# Patient Record
Sex: Female | Born: 2000
Health system: Southern US, Community
[De-identification: ages and names within clinical notes are randomized; demographics above are authoritative.]

---
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
## 2021-01-19 ENCOUNTER — Other Ambulatory Visit (HOSPITAL_COMMUNITY): Payer: Self-pay

## 2021-01-19 ENCOUNTER — Other Ambulatory Visit: Payer: Self-pay

## 2021-01-19 ENCOUNTER — Ambulatory Visit (HOSPITAL_COMMUNITY)
Admission: EM | Admit: 2021-01-19 | Discharge: 2021-01-19 | Disposition: A | Discharge status: Home / Self Care | Payer: 59 | Attending: Internal Medicine | Admitting: Internal Medicine

## 2021-01-19 DIAGNOSIS — Z20822 Contact with and (suspected) exposure to covid-19: Secondary | ICD-10-CM | POA: Diagnosis not present

## 2021-01-19 LAB — SARS CORONAVIRUS 2 (TAT 6-24 HRS): SARS Coronavirus 2: POSITIVE — AB

## 2021-01-19 MED FILL — Levonorgestrel & Ethinyl Estradiol Tab 0.1 MG-20 MCG: ORAL | 28 days supply | Qty: 28 | Fill #0 | Status: CN

## 2021-01-19 NOTE — ED Triage Notes (Signed)
Pt in requesting covid test due to exposure  Denies any sxs

## 2021-01-23 ENCOUNTER — Other Ambulatory Visit (HOSPITAL_COMMUNITY): Payer: Self-pay

## 2021-01-23 MED FILL — Levonorgestrel & Ethinyl Estradiol Tab 0.1 MG-20 MCG: ORAL | 84 days supply | Qty: 84 | Fill #0 | Status: AC

## 2021-01-29 ENCOUNTER — Other Ambulatory Visit (HOSPITAL_COMMUNITY): Payer: Self-pay

## 2021-01-29 MED ORDER — CETIRIZINE HCL 10 MG PO TABS
ORAL_TABLET | ORAL | 5 refills | Status: DC
  Filled 2021-01-29: qty 30, 30d supply, fill #0

## 2021-03-02 ENCOUNTER — Other Ambulatory Visit (HOSPITAL_COMMUNITY): Payer: Self-pay

## 2021-03-02 MED ORDER — BLISOVI 24 FE 1-20 MG-MCG(24) PO TABS
1.0000 | ORAL_TABLET | Freq: Every day | ORAL | 10 refills | Status: DC
  Filled 2021-03-02: qty 28, 28d supply, fill #0
  Filled 2021-03-26: qty 28, 28d supply, fill #1
  Filled 2021-04-27: qty 28, 28d supply, fill #2
  Filled 2021-06-08: qty 28, 28d supply, fill #3
  Filled 2021-07-05: qty 28, 28d supply, fill #4
  Filled 2021-08-03: qty 28, 28d supply, fill #5
  Filled 2021-09-03: qty 28, 28d supply, fill #6
  Filled 2021-10-03: qty 28, 28d supply, fill #7

## 2021-03-26 ENCOUNTER — Other Ambulatory Visit (HOSPITAL_COMMUNITY): Payer: Self-pay

## 2021-04-27 ENCOUNTER — Other Ambulatory Visit (HOSPITAL_COMMUNITY): Payer: Self-pay

## 2021-06-05 ENCOUNTER — Other Ambulatory Visit (HOSPITAL_COMMUNITY): Payer: Self-pay

## 2021-06-05 MED ORDER — CETIRIZINE HCL 10 MG PO TABS
10.0000 mg | ORAL_TABLET | Freq: Every day | ORAL | 3 refills | Status: DC | PRN
  Filled 2021-06-05: qty 30, 30d supply, fill #0

## 2021-06-08 ENCOUNTER — Other Ambulatory Visit (HOSPITAL_COMMUNITY): Payer: Self-pay

## 2021-07-03 ENCOUNTER — Other Ambulatory Visit: Payer: Self-pay

## 2021-07-03 ENCOUNTER — Encounter: Payer: Self-pay | Admitting: Internal Medicine

## 2021-07-03 ENCOUNTER — Ambulatory Visit (INDEPENDENT_AMBULATORY_CARE_PROVIDER_SITE_OTHER): Payer: 59 | Admitting: Internal Medicine

## 2021-07-03 VITALS — BP 108/68 | HR 95 | Resp 18 | Ht 60.5 in | Wt 119.6 lb

## 2021-07-03 DIAGNOSIS — R630 Anorexia: Secondary | ICD-10-CM

## 2021-07-03 DIAGNOSIS — G4710 Hypersomnia, unspecified: Secondary | ICD-10-CM

## 2021-07-03 DIAGNOSIS — E789 Disorder of lipoprotein metabolism, unspecified: Secondary | ICD-10-CM

## 2021-07-03 DIAGNOSIS — Z13220 Encounter for screening for lipoid disorders: Secondary | ICD-10-CM

## 2021-07-03 LAB — CBC
HCT: 38.3 % (ref 36.0–49.0)
Hemoglobin: 13.2 g/dL (ref 12.0–16.0)
MCHC: 34.4 g/dL (ref 31.0–37.0)
MCV: 85 fl (ref 78.0–98.0)
Platelets: 255 10*3/uL (ref 150.0–575.0)
RBC: 4.51 Mil/uL (ref 3.80–5.70)
RDW: 13.5 % (ref 11.4–15.5)
WBC: 10.4 10*3/uL (ref 4.5–13.5)

## 2021-07-03 LAB — LIPID PANEL
Cholesterol: 128 mg/dL (ref 0–200)
HDL: 43.7 mg/dL (ref >39.00)
LDL Cholesterol: 70 mg/dL (ref 0–99)
NonHDL: 84.05
Total CHOL/HDL Ratio: 3
Triglycerides: 68 mg/dL (ref 0.0–149.0)
VLDL: 13.6 mg/dL (ref 0.0–40.0)

## 2021-07-03 LAB — TSH: TSH: 1.59 u[IU]/mL (ref 0.40–5.00)

## 2021-07-03 LAB — COMPREHENSIVE METABOLIC PANEL
ALT: 9 U/L (ref 0–35)
AST: 16 U/L (ref 0–37)
Albumin: 4.3 g/dL (ref 3.5–5.2)
Alkaline Phosphatase: 41 U/L — ABNORMAL LOW (ref 47–119)
BUN: 12 mg/dL (ref 6–23)
CO2: 27 mEq/L (ref 19–32)
Calcium: 9.2 mg/dL (ref 8.4–10.5)
Chloride: 104 mEq/L (ref 96–112)
Creatinine, Ser: 0.78 mg/dL (ref 0.40–1.20)
GFR: 109.97 mL/min (ref >60.00)
Glucose, Bld: 90 mg/dL (ref 70–99)
Potassium: 3.6 mEq/L (ref 3.5–5.1)
Sodium: 140 mEq/L (ref 135–145)
Total Bilirubin: 0.5 mg/dL (ref 0.2–1.2)
Total Protein: 7 g/dL (ref 6.0–8.3)

## 2021-07-03 LAB — VITAMIN B12: Vitamin B-12: 229 pg/mL (ref 211–911)

## 2021-07-03 LAB — VITAMIN D 25 HYDROXY (VIT D DEFICIENCY, FRACTURES): VITD: 23.62 ng/mL — ABNORMAL LOW (ref 30.00–100.00)

## 2021-07-03 NOTE — Patient Instructions (Signed)
We will check the labs today to see if we can find a cause for the lack of appetite and the sleepiness.

## 2021-07-03 NOTE — Progress Notes (Signed)
   Subjective:   Patient ID: Christina Le, female    DOB: [birth year], 20 y.o.   MRN: [id number]  HPI The patient is a new 20 YO female coming in for concerns about poor appetite, falling asleep easily in the afternoon.   PMH, [facility], social history reviewed and updated  Review of Systems  Constitutional:  Positive for fatigue.  HENT: Negative.    Eyes: Negative.   Respiratory:  Negative for cough, chest tightness and shortness of breath.   Cardiovascular:  Negative for chest pain, palpitations and leg swelling.  Gastrointestinal:  Negative for abdominal distention, abdominal pain, constipation, diarrhea, nausea and vomiting.  Musculoskeletal: Negative.   Skin: Negative.   Neurological: Negative.   Psychiatric/Behavioral:  Positive for sleep disturbance.    Objective:  Physical Exam Constitutional:      Appearance: She is well-developed.  HENT:     Head: Normocephalic and atraumatic.  Cardiovascular:     Rate and Rhythm: Normal rate and regular rhythm.  Pulmonary:     Effort: Pulmonary effort is normal. No respiratory distress.     Breath sounds: Normal breath sounds. No wheezing or rales.  Abdominal:     General: Bowel sounds are normal. There is no distension.     Palpations: Abdomen is soft.     Tenderness: There is no abdominal tenderness. There is no rebound.  Musculoskeletal:     Cervical back: Normal range of motion.  Skin:    General: Skin is warm and dry.  Neurological:     Mental Status: She is alert and oriented to person, place, and time.     Coordination: Coordination normal.    Vitals:   07/03/21 1455  BP: 108/68  Pulse: 95  Resp: 18  SpO2: 97%  Weight: 119 lb 9.6 oz (54.3 kg)  Height: 5' 0.5" (1.537 m)    This visit occurred during the SARS-CoV-2 public health emergency.  Safety protocols were in place, including screening questions prior to the visit, additional usage of staff PPE, and extensive cleaning of exam room while observing appropriate  contact time as indicated for disinfecting solutions.   Assessment & Plan:

## 2021-07-05 ENCOUNTER — Other Ambulatory Visit (HOSPITAL_COMMUNITY): Payer: Self-pay

## 2021-07-06 DIAGNOSIS — Z13220 Encounter for screening for lipoid disorders: Secondary | ICD-10-CM | POA: Insufficient documentation

## 2021-07-06 DIAGNOSIS — R630 Anorexia: Secondary | ICD-10-CM | POA: Insufficient documentation

## 2021-07-06 DIAGNOSIS — G4710 Hypersomnia, unspecified: Secondary | ICD-10-CM | POA: Insufficient documentation

## 2021-07-06 NOTE — Assessment & Plan Note (Signed)
Checking labs including vitamin B12 and D and CBC and CMP and HgA1c and TSH to assess for cause. She has lost weight in the last 6-12 months due to poor appetite. She did switch birth control in the last year or so and does not feel that this is related. It is possible that this is related so if all labs normal will recommend she speak with her ob/gyn about changing birth control.

## 2021-07-06 NOTE — Assessment & Plan Note (Signed)
No prior testing so ordered lipid panel today for screen for lipid disorder.

## 2021-07-06 NOTE — Assessment & Plan Note (Signed)
Unclear etiology and most of the time symptoms are between 2-4 pm. She is not able to describe any muscle trembling during sleep. She has not fallen asleep behind the wheel. Checking vitamin deficiencies today and thyroid to assess for medical cause as well as CBC and CMP. If normal will work on nutrition and if symptoms still persistent can check sleep study.

## 2021-08-03 ENCOUNTER — Other Ambulatory Visit (HOSPITAL_COMMUNITY): Payer: Self-pay

## 2021-09-03 ENCOUNTER — Other Ambulatory Visit (HOSPITAL_COMMUNITY): Payer: Self-pay

## 2021-10-03 ENCOUNTER — Other Ambulatory Visit (HOSPITAL_COMMUNITY): Payer: Self-pay

## 2021-11-08 ENCOUNTER — Other Ambulatory Visit (HOSPITAL_COMMUNITY): Payer: Self-pay

## 2021-11-08 MED ORDER — BLISOVI 24 FE 1-20 MG-MCG(24) PO TABS
1.0000 | ORAL_TABLET | Freq: Every day | ORAL | 10 refills | Status: DC
  Filled 2021-11-08: qty 28, 28d supply, fill #0

## 2021-11-16 ENCOUNTER — Other Ambulatory Visit (HOSPITAL_COMMUNITY): Payer: Self-pay

## 2022-01-29 ENCOUNTER — Other Ambulatory Visit (HOSPITAL_COMMUNITY): Payer: Self-pay

## 2022-01-29 MED ORDER — CETIRIZINE HCL 10 MG PO TABS
10.0000 mg | ORAL_TABLET | Freq: Every day | ORAL | 3 refills | Status: DC | PRN
  Filled 2022-01-29: qty 30, 30d supply, fill #0

## 2022-01-30 ENCOUNTER — Other Ambulatory Visit (HOSPITAL_COMMUNITY): Payer: Self-pay

## 2022-08-19 ENCOUNTER — Other Ambulatory Visit (HOSPITAL_COMMUNITY): Payer: Self-pay

## 2022-08-19 MED ORDER — NORGESTIMATE-ETH ESTRADIOL 0.25-35 MG-MCG PO TABS
1.0000 | ORAL_TABLET | Freq: Every day | ORAL | 3 refills | Status: AC
  Filled 2022-08-19: qty 84, 84d supply, fill #0

## 2022-09-20 ENCOUNTER — Encounter (HOSPITAL_COMMUNITY): Payer: Self-pay

## 2022-09-20 ENCOUNTER — Emergency Department (HOSPITAL_COMMUNITY)
Admission: EM | Admit: 2022-09-20 | Discharge: 2022-09-21 | Discharge status: Against Medical Advice | Payer: 59 | Attending: Emergency Medicine | Admitting: Emergency Medicine

## 2022-09-20 ENCOUNTER — Other Ambulatory Visit: Payer: Self-pay

## 2022-09-20 DIAGNOSIS — R079 Chest pain, unspecified: Secondary | ICD-10-CM | POA: Diagnosis not present

## 2022-09-20 DIAGNOSIS — Z5321 Procedure and treatment not carried out due to patient leaving prior to being seen by health care provider: Secondary | ICD-10-CM | POA: Insufficient documentation

## 2022-09-20 DIAGNOSIS — R0602 Shortness of breath: Secondary | ICD-10-CM | POA: Insufficient documentation

## 2022-09-20 DIAGNOSIS — R42 Dizziness and giddiness: Secondary | ICD-10-CM | POA: Diagnosis not present

## 2022-09-20 DIAGNOSIS — M791 Myalgia, unspecified site: Secondary | ICD-10-CM | POA: Diagnosis not present

## 2022-09-20 DIAGNOSIS — R059 Cough, unspecified: Secondary | ICD-10-CM | POA: Insufficient documentation

## 2022-09-20 DIAGNOSIS — Z1152 Encounter for screening for COVID-19: Secondary | ICD-10-CM | POA: Insufficient documentation

## 2022-09-20 NOTE — ED Triage Notes (Signed)
Pt c/o dizziness, weakness, chest pain, cough, and eye pain since yesterday.

## 2022-09-20 NOTE — ED Provider Triage Note (Signed)
Emergency Medicine Provider Triage Evaluation Note  Debie Ashline , a 21 y.o. female  was evaluated in triage.  Pt complains of bodyaches, lightheadedness, cough, shortness of breath, chest pain x 48 hours since she woke up yesterday.  No history of the same..  Review of Systems  Positive: As above Negative: syncope  Physical Exam  BP 122/77   Pulse (!) 124   Temp 100.1 F (37.8 C)   Resp 17   SpO2 99%  Gen:   Awake, no distress   Resp:  Normal effort  MSK:   Moves extremities without difficulty  Other:  RRR no m/r/g, lungs CTAB  Medical Decision Making  Medically screening exam initiated at 11:46 PM.  Appropriate orders placed.  Adaijah Endres was informed that the remainder of the evaluation will be completed by another provider, this initial triage assessment does not replace that evaluation, and the importance of remaining in the ED until their evaluation is complete.  This chart was dictated using voice recognition software, Dragon. Despite the best efforts of this provider to proofread and correct errors, errors may still occur which can change documentation meaning.    Paris Lore, PA-C 09/20/22 [phone]

## 2022-09-21 ENCOUNTER — Emergency Department (HOSPITAL_COMMUNITY): Payer: 59

## 2022-09-21 LAB — CBC WITH DIFFERENTIAL/PLATELET
Abs Immature Granulocytes: 0.01 10*3/uL (ref 0.00–0.07)
Basophils Absolute: 0 10*3/uL (ref 0.0–0.1)
Basophils Relative: 0 %
Eosinophils Absolute: 0 10*3/uL (ref 0.0–0.5)
Eosinophils Relative: 0 %
HCT: 37 % (ref 36.0–46.0)
Hemoglobin: 12 g/dL (ref 12.0–15.0)
Immature Granulocytes: 0 %
Lymphocytes Relative: 15 %
Lymphs Abs: 0.9 10*3/uL (ref 0.7–4.0)
MCH: 27.5 pg (ref 26.0–34.0)
MCHC: 32.4 g/dL (ref 30.0–36.0)
MCV: 84.7 fL (ref 80.0–100.0)
Monocytes Absolute: 1.2 10*3/uL — ABNORMAL HIGH (ref 0.1–1.0)
Monocytes Relative: 20 %
Neutro Abs: 3.7 10*3/uL (ref 1.7–7.7)
Neutrophils Relative %: 65 %
Platelets: 231 10*3/uL (ref 150–400)
RBC: 4.37 MIL/uL (ref 3.87–5.11)
RDW: 13.6 % (ref 11.5–15.5)
WBC: 5.7 10*3/uL (ref 4.0–10.5)
nRBC: 0 % (ref 0.0–0.2)

## 2022-09-21 LAB — BASIC METABOLIC PANEL
Anion gap: 7 (ref 5–15)
BUN: 6 mg/dL (ref 6–20)
CO2: 24 mmol/L (ref 22–32)
Calcium: 8.7 mg/dL — ABNORMAL LOW (ref 8.9–10.3)
Chloride: 102 mmol/L (ref 98–111)
Creatinine, Ser: 0.74 mg/dL (ref 0.44–1.00)
GFR, Estimated: >60 mL/min (ref >60)
Glucose, Bld: 163 mg/dL — ABNORMAL HIGH (ref 70–99)
Potassium: 3.5 mmol/L (ref 3.5–5.1)
Sodium: 133 mmol/L — ABNORMAL LOW (ref 135–145)

## 2022-09-21 LAB — TROPONIN I (HIGH SENSITIVITY)
Troponin I (High Sensitivity): <2 ng/L (ref <18)
Troponin I (High Sensitivity): <2 ng/L (ref <18)

## 2022-09-21 LAB — I-STAT BETA HCG BLOOD, ED (MC, WL, AP ONLY): I-stat hCG, quantitative: 29 m[IU]/mL — ABNORMAL HIGH (ref <5)

## 2022-09-21 LAB — RESP PANEL BY RT-PCR (RSV, FLU A&B, COVID)  RVPGX2
Influenza A by PCR: NEGATIVE
Influenza B by PCR: POSITIVE — AB
Resp Syncytial Virus by PCR: NEGATIVE
SARS Coronavirus 2 by RT PCR: NEGATIVE

## 2022-09-21 NOTE — ED Notes (Signed)
Pt gave registration labels and notified registration that pt was leaving. This NT given labels and seen leaving ED.

## 2022-09-26 ENCOUNTER — Telehealth: Payer: Self-pay

## 2022-09-26 NOTE — Telephone Encounter (Signed)
swi

## 2022-09-26 NOTE — Telephone Encounter (Signed)
Transition Care Management Unsuccessful Follow-up Telephone Call  Date of discharge and from where:  09/21/2022  Attempts:  1st Attempt  Reason for unsuccessful TCM follow-up call:  Left voice message    

## 2022-09-26 NOTE — Telephone Encounter (Signed)
Spoke with patient and she stated that she was not seen by any provider in the hospital and waited over about 8 hours and was never seen patient did end up leaving [facility] with no discharge papers.

## 2022-10-11 ENCOUNTER — Encounter: Payer: Self-pay | Admitting: Internal Medicine

## 2022-10-11 ENCOUNTER — Ambulatory Visit (INDEPENDENT_AMBULATORY_CARE_PROVIDER_SITE_OTHER): Payer: 59 | Admitting: Internal Medicine

## 2022-10-11 VITALS — BP 118/72 | HR 89 | Temp 98.0°F | Ht 60.0 in | Wt 123.0 lb

## 2022-10-11 DIAGNOSIS — J101 Influenza due to other identified influenza virus with other respiratory manifestations: Secondary | ICD-10-CM | POA: Diagnosis not present

## 2022-10-11 DIAGNOSIS — R7989 Other specified abnormal findings of blood chemistry: Secondary | ICD-10-CM | POA: Insufficient documentation

## 2022-10-11 LAB — POCT URINE PREGNANCY: Preg Test, Ur: NEGATIVE

## 2022-10-11 NOTE — Progress Notes (Signed)
   Subjective:   Patient ID: Christina Le, female    DOB: Feb 06, 2001, 22 y.o.   MRN: [id number]  HPI The patient is a 22 YO female coming in for ER follow up.   Review of Systems  Constitutional: Negative.   HENT: Negative.    Eyes: Negative.   Respiratory:  Negative for cough, chest tightness and shortness of breath.   Cardiovascular:  Negative for chest pain, palpitations and leg swelling.  Gastrointestinal:  Negative for abdominal distention, abdominal pain, constipation, diarrhea, nausea and vomiting.  Musculoskeletal: Negative.   Skin: Negative.   Neurological: Negative.   Psychiatric/Behavioral: Negative.      Objective:  Physical Exam Constitutional:      Appearance: She is well-developed.  HENT:     Head: Normocephalic and atraumatic.  Cardiovascular:     Rate and Rhythm: Normal rate and regular rhythm.  Pulmonary:     Effort: Pulmonary effort is normal. No respiratory distress.     Breath sounds: Normal breath sounds. No wheezing or rales.  Abdominal:     General: Bowel sounds are normal. There is no distension.     Palpations: Abdomen is soft.     Tenderness: There is no abdominal tenderness. There is no rebound.  Musculoskeletal:     Cervical back: Normal range of motion.  Skin:    General: Skin is warm and dry.  Neurological:     Mental Status: She is alert and oriented to person, place, and time.     Coordination: Coordination normal.     Vitals:   10/11/22 0955  BP: 118/72  Pulse: 89  Temp: 98 F (36.7 C)  TempSrc: Oral  SpO2: 98%  Weight: 123 lb (55.8 kg)  Height: 5' (1.524 m)    Assessment & Plan:

## 2022-10-11 NOTE — Assessment & Plan Note (Signed)
Recovered, offered flu vaccine and she declines.

## 2022-10-11 NOTE — Assessment & Plan Note (Signed)
Patient had a mild elevation HCG serum in ER which she was not informed about. She was off OCP for several months and started back 2 weeks ago. POC urine hcg done in office to assess pregnancy status which is negative. She is informed this could have been a false positive and she is okay to continue taking her OCPs.

## 2022-11-11 ENCOUNTER — Other Ambulatory Visit (HOSPITAL_COMMUNITY): Payer: Self-pay

## 2022-11-11 MED ORDER — NORGESTIMATE-ETH ESTRADIOL 0.25-35 MG-MCG PO TABS
1.0000 | ORAL_TABLET | Freq: Every day | ORAL | 3 refills | Status: AC
  Filled 2022-11-11: qty 84, 84d supply, fill #0

## 2022-11-11 MED ORDER — FLUCONAZOLE 150 MG PO TABS
150.0000 mg | ORAL_TABLET | ORAL | 0 refills | Status: AC
  Filled 2022-11-11: qty 2, 2d supply, fill #0

## 2022-11-11 MED ORDER — METRONIDAZOLE 0.75 % VA GEL
1.0000 | Freq: Every evening | VAGINAL | 0 refills | Status: AC
  Filled 2022-11-11: qty 70, 5d supply, fill #0

## 2022-11-11 MED ORDER — NORELGESTROMIN-ETH ESTRADIOL 150-35 MCG/24HR TD PTWK
1.0000 | MEDICATED_PATCH | TRANSDERMAL | 4 refills | Status: AC
  Filled 2022-11-11: qty 9, 63d supply, fill #0
  Filled 2023-01-08: qty 9, 63d supply, fill #1
  Filled 2023-03-12: qty 9, 63d supply, fill #2

## 2022-11-12 ENCOUNTER — Other Ambulatory Visit (HOSPITAL_COMMUNITY): Payer: Self-pay

## 2023-02-17 ENCOUNTER — Other Ambulatory Visit: Payer: Self-pay

## 2023-02-17 ENCOUNTER — Ambulatory Visit (HOSPITAL_COMMUNITY)
Admission: EM | Admit: 2023-02-17 | Discharge: 2023-02-17 | Disposition: A | Discharge status: Home / Self Care | Payer: 59 | Attending: Urgent Care | Admitting: Urgent Care

## 2023-02-17 ENCOUNTER — Encounter (HOSPITAL_COMMUNITY): Payer: Self-pay | Admitting: *Deleted

## 2023-02-17 DIAGNOSIS — S61319A Laceration without foreign body of unspecified finger with damage to nail, initial encounter: Secondary | ICD-10-CM

## 2023-02-17 MED ORDER — PENTAFLUOROPROP-TETRAFLUOROETH EX AERO
INHALATION_SPRAY | CUTANEOUS | Status: AC
  Filled 2023-02-17: qty 30

## 2023-02-17 MED ORDER — LIDOCAINE-EPINEPHRINE-TETRACAINE (LET) TOPICAL GEL
TOPICAL | Status: AC
  Filled 2023-02-17: qty 3

## 2023-02-17 MED ORDER — LIDOCAINE-EPINEPHRINE-TETRACAINE (LET) TOPICAL GEL
3.0000 mL | Freq: Once | TOPICAL | Status: AC
  Administered 2023-02-17: 3 mL via TOPICAL

## 2023-02-17 NOTE — ED Provider Notes (Signed)
[facility]    CSN: [id number] Arrival date & time: 02/17/23  1759      History   Chief Complaint Chief Complaint  Patient presents with  . Nail Problem    HPI Christina Le is a 22 y.o. female.   22yo female presents today due to injury to her R thumb. While at work today, around [employer], a child landed on her thumb, causing her acrylic nail to partially avulse and lacerate the lateral nail bed. Pt states this just occurred prior to presentation and is very tender. The nail is still located under the nail matrix, but cut the side of her finger. She did not do any home remedies prior to visit today.     History reviewed. No pertinent past medical history.  Patient Active Problem List   Diagnosis Date Noted  . Elevated serum hCG 10/11/2022  . Influenza B 10/11/2022  . Loss of appetite 07/06/2021  . Excessive sleepiness 07/06/2021  . Screening for lipid disorders 07/06/2021    History reviewed. No pertinent surgical history.  OB History   No obstetric history on file.      Home Medications    Prior to Admission medications   Medication Sig Start Date End Date Taking? Authorizing Provider  metroNIDAZOLE (METROGEL) 0.75 % vaginal gel Insert 1 applicatorful every day by vaginal route at bedtime for 5 days. 11/11/22     norelgestromin-ethinyl estradiol [facility] [employer]) 150-35 MCG/24HR transdermal patch Place 1 patch onto the skin once a week. 11/11/22     norgestimate-ethinyl estradiol (SPRINTEC 28) 0.25-35 MG-MCG tablet Take 1 tablet by mouth daily. 08/16/22     norgestimate-ethinyl estradiol (SPRINTEC 28) 0.25-35 MG-MCG tablet Take 1 tablet by mouth daily. 11/11/22       Family History History reviewed. No pertinent family history.  Social History Social History   Tobacco Use  . Smoking status: Never  . Smokeless tobacco: Never  Vaping Use  . Vaping Use: Never used  Substance Use Topics  . Alcohol use: Never  . Drug use: Never     Allergies   Grass  pollen(k-o-r-t-swt vern), Pollen extract, American cockroach, and Dog epithelium (canis lupus familiaris)   Review of Systems Review of Systems As per HPI  Physical Exam Triage Vital Signs ED Triage Vitals  Enc Vitals Group     BP 02/17/23 1844 113/69     Pulse Rate 02/17/23 1844 90     Resp 02/17/23 1844 18     Temp 02/17/23 1844 98.3 F (36.8 C)     Temp src --      SpO2 02/17/23 1844 99 %     Weight --      Height --      Head Circumference --      Peak Flow --      Pain Score 02/17/23 1842 0     Pain Loc --      Pain Edu? --      Excl. in GC? --    No data found.  Updated Vital Signs BP 113/69   Pulse 90   Temp 98.3 F (36.8 C)   Resp 18   LMP 01/18/2023   SpO2 99%   Visual Acuity Right Eye Distance:   Left Eye Distance:   Bilateral Distance:    Right Eye Near:   Left Eye Near:    Bilateral Near:     Physical Exam Vitals and nursing note reviewed. Exam conducted with a chaperone present.  Constitutional:  General: She is not in acute distress.    Appearance: Normal appearance. She is normal weight. She is not ill-appearing, toxic-appearing or diaphoretic.  HENT:     Head: Normocephalic and atraumatic.  Cardiovascular:     Rate and Rhythm: Normal rate.  Pulmonary:     Effort: Pulmonary effort is normal. No respiratory distress.  Musculoskeletal:        General: Tenderness (to R nail) and signs of injury (1-1.5cm simple lac to R thumb lateral nail bed. Acrylic and real nail still within normal placement under the nail matrix to remaining nail) present. No swelling. Normal range of motion.  Skin:    General: Skin is warm and dry.     Coloration: Skin is not jaundiced.     Findings: No bruising or rash.  Neurological:     General: No focal deficit present.     Mental Status: She is alert and oriented to person, place, and time.     Sensory: No sensory deficit.     Motor: No weakness.     Comments: Full sensation to distal phalanx  Psychiatric:         Mood and Affect: Mood is anxious.     UC Treatments / Results  Labs (all labs ordered are listed, but only abnormal results are displayed) Labs Reviewed - No data to display  EKG   Radiology No results found.  Procedures Nerve Block  Date/Time: 02/17/2023 7:00 PM  Performed by: Maretta Bees, PA Authorized by: Maretta Bees, PA   Consent:    Consent obtained:  Verbal   Consent given by:  Patient   Risks discussed:  Bleeding, infection, nerve damage, pain, swelling, unsuccessful block, allergic reaction and intravenous injection   Alternatives discussed:  No treatment and alternative treatment Universal protocol:    Procedure explained and questions answered to patient or proxy's satisfaction: yes     Site/side marked: yes     Patient identity confirmed:  Verbally with patient Indications:    Indications:  Procedural anesthesia Location:    Body area:  Upper extremity   Upper extremity nerve:  Metacarpal   Laterality:  Right Pre-procedure details:    Skin preparation:  Povidone-iodine Skin anesthesia:    Skin anesthesia method:  Topical application   Topical anesthetic:  LET (ethyl chloride spray also used) Procedure details:    Anesthetic injected:  Lidocaine 1% w/o epi   Steroid injected:  None   Paresthesia:  None Post-procedure details:    Dressing:  None Laceration Repair  Date/Time: 02/17/2023 7:15 PM  Performed by: Maretta Bees, PA Authorized by: Maretta Bees, PA   Consent:    Consent obtained:  Verbal   Consent given by:  Patient   Risks, benefits, and alternatives were discussed: yes     Risks discussed:  Infection, pain, retained foreign body, tendon damage, vascular damage, poor wound healing, poor cosmetic result, need for additional repair and nerve damage   Alternatives discussed:  No treatment, delayed treatment and observation Universal protocol:    Procedure explained and questions answered to patient or proxy's  satisfaction: yes     Site/side marked: yes     Patient identity confirmed:  Verbally with patient Anesthesia:    Anesthesia method:  Nerve block and topical application   Topical anesthetic:  LET   Block location:  Digital block   Block needle gauge:  25 G   Block anesthetic:  Lidocaine 1% w/o epi   Block injection  procedure:  Introduced needle, incremental injection, negative aspiration for blood and anatomic landmarks palpated   Block outcome:  Anesthesia achieved Laceration details:    Location:  Finger   Finger location:  R thumb   Length (cm):  1 Treatment:    Area cleansed with:  Povidone-iodine   Amount of cleaning:  Standard   Irrigation solution:  Tap water Skin repair:    Repair method:  Tissue adhesive Approximation:    Approximation:  Close Repair type:    Repair type:  Simple Post-procedure details:    Dressing:  Open (no dressing)   Procedure completion:  Tolerated well, no immediate complications  (including critical care time)  Medications Ordered in UC Medications  lidocaine-EPINEPHrine-tetracaine (LET) topical gel (3 mLs Topical Given 02/17/23 1924)    Initial Impression / Assessment and Plan / UC Course  I have reviewed the triage vital signs and the nursing notes.  Pertinent labs & imaging results that were available during my care of the patient were reviewed by me and considered in my medical decision making (see chart for details).     Laceration to lateral nail bed on R thumb - after digital block performed and topical LET gel applied, tried to "tuck" visible nail under the nail bed, however the skin was completely lacerated and thus this was not possible. Tweezers and hemostats used to approximate the remaining skin as close as possible over the nail bed. Dermabond glue then used to cover the laceration and provide stability to the nail. Pt tolerated procedure. No signs of infection. Splint placed over thumb/ nail to prevent repeat injury.   Final  Clinical Impressions(s) / UC Diagnoses   Final diagnoses:  Laceration of nail bed of finger, initial encounter     Discharge Instructions      Follow the instruction sheet for the laceration care with skin glue.  Take tylenol 1000mg  or ibuprofen 800mg  alternating every 4 hours as needed for pain control.  Wear the splint over the finger and fingernail for the next 7 days to ensure no repeat trauma to the finger.     ED Prescriptions   None    PDMP not reviewed this encounter.   Maretta Bees, [place] 02/18/23 1213

## 2023-02-17 NOTE — ED Triage Notes (Signed)
Pt reports her Rt thumb nail was knocked off at school today.

## 2023-02-17 NOTE — Discharge Instructions (Addendum)
Follow the instruction sheet for the laceration care with skin glue.  Take tylenol 1000mg  or ibuprofen 800mg  alternating every 4 hours as needed for pain control.  Wear the splint over the finger and fingernail for the next 7 days to ensure no repeat trauma to the finger.

## 2023-03-28 ENCOUNTER — Other Ambulatory Visit (HOSPITAL_COMMUNITY): Payer: Self-pay

## 2023-04-07 ENCOUNTER — Other Ambulatory Visit (HOSPITAL_COMMUNITY): Payer: Self-pay

## 2023-04-07 MED ORDER — MEDROXYPROGESTERONE ACETATE 150 MG/ML IM SUSP
150.0000 mg | INTRAMUSCULAR | 0 refills | Status: AC
  Filled 2023-04-07: qty 1, 90d supply, fill #0

## 2023-04-09 ENCOUNTER — Other Ambulatory Visit (HOSPITAL_COMMUNITY): Payer: Self-pay

## 2023-04-11 ENCOUNTER — Telehealth: Payer: Self-pay

## 2023-04-11 NOTE — Telephone Encounter (Signed)
LVM for patient to call back [phone], or to call PCP office to schedule follow up apt. AS, CMA  

## 2023-07-22 ENCOUNTER — Other Ambulatory Visit (HOSPITAL_COMMUNITY): Payer: Self-pay

## 2023-07-22 MED ORDER — MEDROXYPROGESTERONE ACETATE 150 MG/ML IM SUSP
150.0000 mg | INTRAMUSCULAR | 1 refills | Status: DC
  Filled 2023-07-22: qty 1, 90d supply, fill #0
  Filled 2023-10-03: qty 1, 90d supply, fill #1

## 2023-12-11 ENCOUNTER — Other Ambulatory Visit (HOSPITAL_COMMUNITY): Payer: Self-pay

## 2023-12-11 MED ORDER — MEDROXYPROGESTERONE ACETATE 150 MG/ML IM SUSP
150.0000 mg | INTRAMUSCULAR | 3 refills | Status: DC
  Filled 2023-12-11: qty 1, 90d supply, fill #0

## 2023-12-12 ENCOUNTER — Other Ambulatory Visit (HOSPITAL_COMMUNITY): Payer: Self-pay

## 2023-12-12 MED ORDER — DOXYCYCLINE HYCLATE 100 MG PO TABS
200.0000 mg | ORAL_TABLET | Freq: Once | ORAL | 0 refills | Status: AC
  Filled 2023-12-12: qty 2, 1d supply, fill #0

## 2024-01-05 ENCOUNTER — Other Ambulatory Visit (HOSPITAL_COMMUNITY): Payer: Self-pay

## 2024-01-05 MED ORDER — ERYTHROMYCIN 5 MG/GM OP OINT
1.0000 | TOPICAL_OINTMENT | Freq: Four times a day (QID) | OPHTHALMIC | 0 refills | Status: AC
  Filled 2024-01-05: qty 3.5, 7d supply, fill #0

## 2024-01-06 ENCOUNTER — Other Ambulatory Visit (HOSPITAL_COMMUNITY): Payer: Self-pay

## 2024-01-06 MED ORDER — NORELGESTROMIN-ETH ESTRADIOL 150-35 MCG/24HR TD PTWK
1.0000 | MEDICATED_PATCH | TRANSDERMAL | 3 refills | Status: DC
  Filled 2024-01-06: qty 9, 84d supply, fill #0
  Filled 2024-03-22: qty 9, 84d supply, fill #1
  Filled 2024-05-26 – 2024-06-01 (×3): qty 9, 84d supply, fill #2
  Filled 2024-08-29: qty 9, 84d supply, fill #3

## 2024-01-07 ENCOUNTER — Other Ambulatory Visit (HOSPITAL_COMMUNITY): Payer: Self-pay

## 2024-01-07 MED ORDER — PREDNISOLONE ACETATE 1 % OP SUSP
1.0000 [drp] | Freq: Four times a day (QID) | OPHTHALMIC | 0 refills | Status: DC
  Filled 2024-01-07: qty 5, 25d supply, fill #0

## 2024-01-09 ENCOUNTER — Other Ambulatory Visit (HOSPITAL_COMMUNITY): Payer: Self-pay

## 2024-05-26 ENCOUNTER — Other Ambulatory Visit: Payer: Self-pay

## 2024-05-28 ENCOUNTER — Other Ambulatory Visit (HOSPITAL_COMMUNITY): Payer: Self-pay

## 2024-10-19 ENCOUNTER — Other Ambulatory Visit (HOSPITAL_COMMUNITY): Payer: Self-pay

## 2024-10-19 MED ORDER — NORELGESTROMIN-ETH ESTRADIOL 150-35 MCG/24HR TD PTWK
1.0000 | MEDICATED_PATCH | TRANSDERMAL | 0 refills | Status: AC
  Filled 2024-11-04: qty 9, 63d supply, fill #0
  Filled ????-??-??: fill #0

## 2024-11-04 ENCOUNTER — Other Ambulatory Visit: Payer: Self-pay
